# Patient Record
Sex: Male | Born: 1985 | Race: White | Hispanic: No | Marital: Married | Smoking: Never smoker
Health system: Southern US, Community
[De-identification: ages and names within clinical notes are randomized; demographics above are authoritative.]

## PROBLEM LIST (undated history)

## (undated) DIAGNOSIS — E7800 Pure hypercholesterolemia, unspecified: Secondary | ICD-10-CM

## (undated) HISTORY — PX: NOSE SURGERY: SHX723

---

## 2016-12-31 ENCOUNTER — Ambulatory Visit (INDEPENDENT_AMBULATORY_CARE_PROVIDER_SITE_OTHER): Payer: BLUE CROSS/BLUE SHIELD | Admitting: Family Medicine

## 2016-12-31 ENCOUNTER — Encounter: Payer: Self-pay | Admitting: Family Medicine

## 2016-12-31 VITALS — BP 124/74 | HR 85 | Temp 98.0°F | Ht 64.0 in | Wt 180.6 lb

## 2016-12-31 DIAGNOSIS — E66811 Obesity, class 1: Secondary | ICD-10-CM | POA: Insufficient documentation

## 2016-12-31 DIAGNOSIS — Z0001 Encounter for general adult medical examination with abnormal findings: Secondary | ICD-10-CM

## 2016-12-31 DIAGNOSIS — E669 Obesity, unspecified: Secondary | ICD-10-CM | POA: Diagnosis not present

## 2016-12-31 LAB — COMPREHENSIVE METABOLIC PANEL
ALBUMIN: 4.9 g/dL (ref 3.5–5.2)
ALK PHOS: 60 U/L (ref 39–117)
ALT: 51 U/L (ref 0–53)
AST: 27 U/L (ref 0–37)
BILIRUBIN TOTAL: 0.6 mg/dL (ref 0.2–1.2)
BUN: 17 mg/dL (ref 6–23)
CO2: 25 mEq/L (ref 19–32)
Calcium: 9.7 mg/dL (ref 8.4–10.5)
Chloride: 103 mEq/L (ref 96–112)
Creatinine, Ser: 0.98 mg/dL (ref 0.40–1.50)
GFR: 94.86 mL/min (ref >60.00)
Glucose, Bld: 83 mg/dL (ref 70–99)
POTASSIUM: 4.6 meq/L (ref 3.5–5.1)
Sodium: 137 mEq/L (ref 135–145)
TOTAL PROTEIN: 7.3 g/dL (ref 6.0–8.3)

## 2016-12-31 LAB — CBC
HEMATOCRIT: 47.1 % (ref 39.0–52.0)
HEMOGLOBIN: 15.4 g/dL (ref 13.0–17.0)
MCHC: 32.8 g/dL (ref 30.0–36.0)
MCV: 91.7 fl (ref 78.0–100.0)
Platelets: 379 10*3/uL (ref 150.0–400.0)
RBC: 5.13 Mil/uL (ref 4.22–5.81)
RDW: 14 % (ref 11.5–15.5)
WBC: 6.9 10*3/uL (ref 4.0–10.5)

## 2016-12-31 LAB — LDL CHOLESTEROL, DIRECT: Direct LDL: 195 mg/dL

## 2016-12-31 LAB — LIPID PANEL
CHOLESTEROL: 263 mg/dL — AB (ref 0–200)
HDL: 45.4 mg/dL (ref >39.00)
NonHDL: 217.33
Total CHOL/HDL Ratio: 6
Triglycerides: 220 mg/dL — ABNORMAL HIGH (ref 0.0–149.0)
VLDL: 44 mg/dL — AB (ref 0.0–40.0)

## 2016-12-31 NOTE — Patient Instructions (Signed)
Preventive Care 18-39 Years, Male Preventive care refers to lifestyle choices and visits with your health care provider that can promote health and wellness. What does preventive care include?  A yearly physical exam. This is also called an annual well check.  Dental exams once or twice a year.  Routine eye exams. Ask your health care provider how often you should have your eyes checked.  Personal lifestyle choices, including: ? Daily care of your teeth and gums. ? Regular physical activity. ? Eating a healthy diet. ? Avoiding tobacco and drug use. ? Limiting alcohol use. ? Practicing safe sex. What happens during an annual well check? The services and screenings done by your health care provider during your annual well check will depend on your age, overall health, lifestyle risk factors, and family history of disease. Counseling Your health care provider may ask you questions about your:  Alcohol use.  Tobacco use.  Drug use.  Emotional well-being.  Home and relationship well-being.  Sexual activity.  Eating habits.  Work and work Statistician.  Screening You may have the following tests or measurements:  Height, weight, and BMI.  Blood pressure.  Lipid and cholesterol levels. These may be checked every 5 years starting at age 34.  Diabetes screening. This is done by checking your blood sugar (glucose) after you have not eaten for a while (fasting).  Skin check.  Hepatitis C blood test.  Hepatitis B blood test.  Sexually transmitted disease (STD) testing.  Discuss your test results, treatment options, and if necessary, the need for more tests with your health care provider. Vaccines Your health care provider may recommend certain vaccines, such as:  Influenza vaccine. This is recommended every year.  Tetanus, diphtheria, and acellular pertussis (Tdap, Td) vaccine. You may need a Td booster every 10 years.  Varicella vaccine. You may need this if you  have not been vaccinated.  HPV vaccine. If you are 23 or younger, you may need three doses over 6 months.  Measles, mumps, and rubella (MMR) vaccine. You may need at least one dose of MMR.You may also need a second dose.  Pneumococcal 13-valent conjugate (PCV13) vaccine. You may need this if you have certain conditions and have not been vaccinated.  Pneumococcal polysaccharide (PPSV23) vaccine. You may need one or two doses if you smoke cigarettes or if you have certain conditions.  Meningococcal vaccine. One dose is recommended if you are age 65-21 years and a first-year college student living in a residence hall, or if you have one of several medical conditions. You may also need additional booster doses.  Hepatitis A vaccine. You may need this if you have certain conditions or if you travel or work in places where you may be exposed to hepatitis A.  Hepatitis B vaccine. You may need this if you have certain conditions or if you travel or work in places where you may be exposed to hepatitis B.  Haemophilus influenzae type b (Hib) vaccine. You may need this if you have certain risk factors.  Talk to your health care provider about which screenings and vaccines you need and how often you need them. This information is not intended to replace advice given to you by your health care provider. Make sure you discuss any questions you have with your health care provider. Document Released: 02/18/2001 Document Revised: 09/12/2015 Document Reviewed: 10/24/2014 Elsevier Interactive Patient Education  [employer].

## 2016-12-31 NOTE — Progress Notes (Signed)
Subjective:  Gregory Singleton is a 31 y.o. male who presents today for his annual comprehensive physical exam and to establish care  HPI:  Patient has still been living in [place] since 2014.  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  Wife's family lives in [place].  Gregory Singleton has no acute complaints today.   Lifestyle Diet: Does not follow any diet strictly.  Has been loosely following the plant paradox diet. Exercise: Regularly goes to the gym to work on cardio and strength training.  Depression screen PHQ 2/9 12/31/2016  Decreased Interest 0  Down, Depressed, Hopeless 0  PHQ - 2 Score 0   Health Maintenance Due  Topic Date Due  . TETANUS/TDAP  01/22/2005    ROS: Per HPI, otherwise a 14 point review of systems was performed and was negative  PMH:  The following were reviewed and entered/updated in epic: History reviewed. No pertinent past medical history. There are no active problems to display for this patient.  History reviewed. No pertinent surgical history.  History reviewed. No pertinent family history.  Medications- reviewed and updated No current outpatient medications on file.   No current facility-administered medications for this visit.    Allergies-reviewed and updated No Known Allergies  Social History   Socioeconomic History  . Marital status: Married    Spouse name: None  . Number of children: None  . Years of education: None  . Highest education level: None  Social Needs  . Financial resource strain: None  . Food insecurity - worry: None  . Food insecurity - inability: None  . Transportation needs - medical: None  . Transportation needs - non-medical: None  Occupational History  . None  Tobacco Use  . Smoking status: Never Smoker  . Smokeless tobacco: Never Used  Substance and Sexual Activity  . Alcohol use: Yes    Comment: Occasionally  . Drug use: No  .  Sexual activity: Yes  Other Topics Concern  . None  Social History Narrative  . None   Objective:  Physical Exam: BP 124/74 (BP Location: Left Arm, Patient Position: Sitting, Cuff Size: Normal)   Pulse 85   Temp 98 F (36.7 C) (Oral)   Ht 5\' 4"  (1.626 m)   Wt 180 lb 9.6 oz (81.9 kg)   SpO2 98%   BMI 31.00 kg/m   Body mass index is 31 kg/m. Gen: NAD, resting comfortably HEENT: TMs normal bilaterally. OP clear. No thyromegaly noted.  CV: RRR with no murmurs appreciated Pulm: NWOB, CTAB with no crackles, wheezes, or rhonchi GI: Normal bowel sounds present. Soft, Nontender, Nondistended. MSK: no edema, cyanosis, or clubbing noted Skin: warm, dry Neuro: CN2-12 grossly intact. Strength 5/5 in upper and lower extremities. Reflexes symmetric and intact bilaterally.  Psych: Normal affect and thought content  Assessment/Plan:  Class 1 obesity without serious comorbidity in adult Discussed lifestyle modifications including healthy diet and regular exercise.  Preventative Healthcare: Check CBC, CMET, and a lipid panel today.  Patient Counseling:  -Nutrition: Stressed importance of moderation in sodium/caffeine intake, saturated fat and cholesterol, caloric balance, sufficient intake of fresh fruits, vegetables, and fiber.  -Stressed the importance of regular exercise.   -Substance Abuse: Discussed cessation/primary prevention of tobacco, alcohol, or other drug use; driving or other dangerous activities under the influence; availability of treatment for abuse.   -Injury prevention: Discussed safety belts, safety helmets, smoke detector, smoking near bedding  or upholstery.   -Sexuality: Discussed sexually transmitted diseases, partner selection, use of condoms, avoidance of unintended pregnancy and contraceptive alternatives.   -Dental health: Discussed importance of regular tooth brushing, flossing, and dental visits.  -Health maintenance and immunizations reviewed. Please refer to  Health maintenance section.  Return to care in 1 year for next preventative visit.   Katina Degreealeb M. Jimmey RalphParker, MD 12/31/2016 10:03 AM

## 2016-12-31 NOTE — Assessment & Plan Note (Signed)
Discussed lifestyle modifications including healthy diet and regular exercise. 

## 2017-01-01 NOTE — Progress Notes (Signed)
Cholesterol is elevated. His "bad" cholesterol is 195, it should ideally be closer to 100 or lower. It would be beneficial to start a cholesterol medication if the patient is willing. Regardless he should continue working on regular exercise and a healthy diet. We should recheck in 6-12 months.  His other tests were all normal.   Kaydee Magel M. Jimmey RalphParker, MD 01/01/2017 8:38 AM

## 2017-01-23 ENCOUNTER — Emergency Department (HOSPITAL_COMMUNITY)
Admission: EM | Admit: 2017-01-23 | Discharge: 2017-01-23 | Disposition: A | Discharge status: Home / Self Care | Payer: BLUE CROSS/BLUE SHIELD | Attending: Emergency Medicine | Admitting: Emergency Medicine

## 2017-01-23 ENCOUNTER — Emergency Department (HOSPITAL_COMMUNITY): Payer: BLUE CROSS/BLUE SHIELD

## 2017-01-23 ENCOUNTER — Encounter (HOSPITAL_COMMUNITY): Payer: Self-pay

## 2017-01-23 ENCOUNTER — Other Ambulatory Visit: Payer: Self-pay

## 2017-01-23 DIAGNOSIS — R109 Unspecified abdominal pain: Secondary | ICD-10-CM | POA: Diagnosis not present

## 2017-01-23 DIAGNOSIS — R1012 Left upper quadrant pain: Secondary | ICD-10-CM | POA: Diagnosis not present

## 2017-01-23 HISTORY — DX: Pure hypercholesterolemia, unspecified: E78.00

## 2017-01-23 LAB — COMPREHENSIVE METABOLIC PANEL
ALT: 33 U/L (ref 17–63)
AST: 25 U/L (ref 15–41)
Albumin: 4.7 g/dL (ref 3.5–5.0)
Alkaline Phosphatase: 71 U/L (ref 38–126)
Anion gap: 7 (ref 5–15)
BUN: 9 mg/dL (ref 6–20)
CALCIUM: 9.7 mg/dL (ref 8.9–10.3)
CHLORIDE: 107 mmol/L (ref 101–111)
CO2: 25 mmol/L (ref 22–32)
Creatinine, Ser: 0.99 mg/dL (ref 0.61–1.24)
GFR calc Af Amer: >60 mL/min (ref >60)
GFR calc non Af Amer: >60 mL/min (ref >60)
Glucose, Bld: 88 mg/dL (ref 65–99)
Potassium: 4.4 mmol/L (ref 3.5–5.1)
SODIUM: 139 mmol/L (ref 135–145)
Total Bilirubin: 0.9 mg/dL (ref 0.3–1.2)
Total Protein: 8.2 g/dL — ABNORMAL HIGH (ref 6.5–8.1)

## 2017-01-23 LAB — URINALYSIS, ROUTINE W REFLEX MICROSCOPIC
BACTERIA UA: NONE SEEN
BILIRUBIN URINE: NEGATIVE
GLUCOSE, UA: NEGATIVE mg/dL
Ketones, ur: NEGATIVE mg/dL
LEUKOCYTES UA: NEGATIVE
Nitrite: NEGATIVE
PROTEIN: NEGATIVE mg/dL
SPECIFIC GRAVITY, URINE: 1.005 (ref 1.005–1.030)
Squamous Epithelial / LPF: NONE SEEN
WBC UA: NONE SEEN WBC/hpf (ref 0–5)
pH: 6 (ref 5.0–8.0)

## 2017-01-23 LAB — CBC
HEMATOCRIT: 46.3 % (ref 39.0–52.0)
HEMOGLOBIN: 15.8 g/dL (ref 13.0–17.0)
MCH: 30.4 pg (ref 26.0–34.0)
MCHC: 34.1 g/dL (ref 30.0–36.0)
MCV: 89 fL (ref 78.0–100.0)
Platelets: 324 10*3/uL (ref 150–400)
RBC: 5.2 MIL/uL (ref 4.22–5.81)
RDW: 13.5 % (ref 11.5–15.5)
WBC: 7.4 10*3/uL (ref 4.0–10.5)

## 2017-01-23 LAB — MONONUCLEOSIS SCREEN: MONO SCREEN: NEGATIVE

## 2017-01-23 LAB — LIPASE, BLOOD: LIPASE: 25 U/L (ref 11–51)

## 2017-01-23 MED ORDER — SODIUM CHLORIDE 0.9 % IV BOLUS (SEPSIS)
1000.0000 mL | Freq: Once | INTRAVENOUS | Status: AC
  Administered 2017-01-23: 1000 mL via INTRAVENOUS

## 2017-01-23 MED ORDER — IOPAMIDOL (ISOVUE-300) INJECTION 61%
100.0000 mL | Freq: Once | INTRAVENOUS | Status: AC | PRN
  Administered 2017-01-23: 100 mL via INTRAVENOUS

## 2017-01-23 MED ORDER — IOPAMIDOL (ISOVUE-300) INJECTION 61%
INTRAVENOUS | Status: AC
Start: 2017-01-23 — End: 2017-01-24
  Filled 2017-01-23: qty 100

## 2017-01-23 NOTE — ED Triage Notes (Signed)
Patient c/o left abdominal pain x 2 days. Patient denies any N/v/D.

## 2017-01-23 NOTE — ED Notes (Signed)
Purple and light green blood tube sent to lab.  Urine cup and urine culture tube sent to lab.

## 2017-01-23 NOTE — ED Provider Notes (Signed)
[facility]-EMERGENCY DEPT Provider Note   CSN: [id number] Arrival date & time: 01/23/17  4098     History   Chief Complaint No chief complaint on file.   HPI Gregory Singleton is a 32 y.o. male.  Patient presents with upper abdominal pain for 3 days left greater than right.  He is able to eat but has a decreased appetite.  He also complains of minimal discomfort in his left medial supraclavicular area.  No prodromal illnesses, fever, sweats, chills, nausea, vomiting, diarrhea, dysuria.  He is normally healthy.  No previous abdominal surgery.  Social history: works as an Pensions consultant.  Severity of symptoms is moderate.  Nothing makes symptoms better or worse.      Past Medical History:  Diagnosis Date  . High cholesterol     Patient Active Problem List   Diagnosis Date Noted  . Class 1 obesity without serious comorbidity in adult 12/31/2016    Past Surgical History:  Procedure Laterality Date  . NOSE SURGERY     Surgery to repair a broken nose at age 19       Home Medications    Prior to Admission medications   Not on File    Family History Family History  Problem Relation Age of Onset  . Cancer Mother   . Heart attack Father   . Cancer Maternal Grandmother   . Heart attack Maternal Grandfather     Social History Social History   Tobacco Use  . Smoking status: Never Smoker  . Smokeless tobacco: Never Used  Substance Use Topics  . Alcohol use: Yes    Comment: Occasionally  . Drug use: No     Allergies   Patient has no known allergies.   Review of Systems Review of Systems  All other systems reviewed and are negative.    Physical Exam Updated Vital Signs BP 133/75 (BP Location: Right Arm)   Pulse 81   Temp 98.3 F (36.8 C) (Oral)   Resp 18   Ht 5\' 4"  (1.626 m)   Wt 77.1 kg (170 lb)   SpO2 100%   BMI 29.18 kg/m   Physical Exam  Constitutional: He is oriented to person, place, and time. He appears well-developed  and well-nourished.  HENT:  Head: Normocephalic and atraumatic.  Eyes: Conjunctivae are normal.  Neck: Neck supple.  Cardiovascular: Normal rate and regular rhythm.  Pulmonary/Chest: Effort normal and breath sounds normal.  Abdominal: Soft. Bowel sounds are normal.  Minimal upper abdominal tenderness left greater than right  Musculoskeletal: Normal range of motion.  Neurological: He is alert and oriented to person, place, and time.  Skin: Skin is warm and dry.  Psychiatric: He has a normal mood and affect. His behavior is normal.  Nursing note and vitals reviewed.    ED Treatments / Results  Labs (all labs ordered are listed, but only abnormal results are displayed) Labs Reviewed  COMPREHENSIVE METABOLIC PANEL - Abnormal; Notable for the following components:      Result Value   Total Protein 8.2 (*)    All other components within normal limits  URINALYSIS, ROUTINE W REFLEX MICROSCOPIC - Abnormal; Notable for the following components:   Color, Urine STRAW (*)    Hgb urine dipstick SMALL (*)    All other components within normal limits  LIPASE, BLOOD  CBC  MONONUCLEOSIS SCREEN    EKG  EKG Interpretation None       Radiology Dg Chest 2 View  Result Date: 01/23/2017  CLINICAL DATA:  32 y/o male with LUQ pain x3 days. Also c/o onset of left clavicle soreness today. Non-smoker. EXAM: CHEST  2 VIEW COMPARISON:  None. FINDINGS: Normal mediastinum and cardiac silhouette. Normal pulmonary vasculature. No evidence of effusion, infiltrate, or pneumothorax. No acute bony abnormality. IMPRESSION: Normal chest radiograph. Electronically Signed   By: Genevive BiStewart  Edmunds M.D.   On: 01/23/2017 19:01   Ct Abdomen Pelvis W Contrast  Result Date: 01/23/2017 CLINICAL DATA:  32 year old male with left-sided abdominal pain for 2 days. EXAM: CT ABDOMEN AND PELVIS WITH CONTRAST TECHNIQUE: Multidetector CT imaging of the abdomen and pelvis was performed using the standard protocol following bolus  administration of intravenous contrast. CONTRAST:  100mL ISOVUE-300 IOPAMIDOL (ISOVUE-300) INJECTION 61% COMPARISON:  None. FINDINGS: Lower chest: Minimal left basilar atelectasis noted. Hepatobiliary: The liver and gallbladder are unremarkable. No biliary dilatation. Pancreas: Unremarkable Spleen: A very small amount of fluid/stranding along the superior aspect of the spleen identified without splenic abnormality. No other abnormalities in this area identified. Adrenals/Urinary Tract: The kidneys, bladder and adrenal glands are unremarkable. Stomach/Bowel: Stomach is within normal limits. Appendix appears normal. No evidence of bowel wall thickening, distention, or inflammatory changes. Vascular/Lymphatic: No significant vascular findings are present. No enlarged abdominal or pelvic lymph nodes. Reproductive: Prostate is unremarkable. Other: No abdominal wall hernia or abnormality. No abdominopelvic ascites. Musculoskeletal: No acute or significant osseous findings. IMPRESSION: 1. Very small amount of fluid/inflammation adjacent to the spleen without splenic or other adjacent abnormality. This is of uncertain clinical significance, but may be causing this patient's left upper abdominal pain. Correlate with any injury to this area. 2. No other significant abnormalities. Electronically Signed   By: Harmon PierJeffrey  Hu M.D.   On: 01/23/2017 18:57    Procedures Procedures (including critical care time)  Medications Ordered in ED Medications  iopamidol (ISOVUE-300) 61 % injection (not administered)  sodium chloride 0.9 % bolus 1,000 mL (0 mLs Intravenous Stopped 01/23/17 2107)  iopamidol (ISOVUE-300) 61 % injection 100 mL (100 mLs Intravenous Contrast Given 01/23/17 1809)     Initial Impression / Assessment and Plan / ED Course  I have reviewed the triage vital signs and the nursing notes.  Pertinent labs & imaging results that were available during my care of the patient were reviewed by me and considered in my  medical decision making (see chart for details).     Patient presents with left upper quadrant greater than right upper quadrant pain.  No acute abdomen on physical exam.  Labs, urinalysis, chest x-ray all normal.   CT reveals a small amount of fluid on the superior aspect of the spleen.  With normal vital signs and labs, I am uncertain of the clinical significance.  No acute abdomen at discharge.  Discussed with the patient and his wife.  Final Clinical Impressions(s) / ED Diagnoses   Final diagnoses:  Left upper quadrant pain    ED Discharge Orders    None       Donnetta Hutchingook, Annalysia Willenbring, MD 01/23/17 2134

## 2017-01-23 NOTE — ED Notes (Signed)
Pt is alert and oriented x 4 and is verbally . Pt reports having abdominal pain x 3 4/10 pain. Pt denies n/v/d, fever. Denies urinary symptoms.

## 2017-01-23 NOTE — Discharge Instructions (Addendum)
Blood work, urinalysis, chest x-ray all normal.  CT scan reveals a possible very small amount of fluid on the upper edge of your spleen.  This could be causing your pain.  There is a mononucleosis test pending.

## 2017-08-21 ENCOUNTER — Encounter: Payer: Self-pay | Admitting: Family Medicine

## 2017-08-21 ENCOUNTER — Ambulatory Visit (INDEPENDENT_AMBULATORY_CARE_PROVIDER_SITE_OTHER): Payer: BLUE CROSS/BLUE SHIELD | Admitting: Family Medicine

## 2017-08-21 VITALS — BP 120/74 | HR 67 | Temp 97.9°F | Ht 64.0 in | Wt 180.6 lb

## 2017-08-21 DIAGNOSIS — Z0001 Encounter for general adult medical examination with abnormal findings: Secondary | ICD-10-CM

## 2017-08-21 DIAGNOSIS — Z6831 Body mass index (BMI) 31.0-31.9, adult: Secondary | ICD-10-CM | POA: Diagnosis not present

## 2017-08-21 DIAGNOSIS — Z13220 Encounter for screening for lipoid disorders: Secondary | ICD-10-CM | POA: Diagnosis not present

## 2017-08-21 DIAGNOSIS — E669 Obesity, unspecified: Secondary | ICD-10-CM

## 2017-08-21 DIAGNOSIS — Z23 Encounter for immunization: Secondary | ICD-10-CM

## 2017-08-21 LAB — LIPID PANEL
Cholesterol: 266 mg/dL — ABNORMAL HIGH (ref 0–200)
HDL: 37.5 mg/dL — ABNORMAL LOW (ref >39.00)
NonHDL: 228.81
Total CHOL/HDL Ratio: 7
Triglycerides: 378 mg/dL — ABNORMAL HIGH (ref 0.0–149.0)
VLDL: 75.6 mg/dL — ABNORMAL HIGH (ref 0.0–40.0)

## 2017-08-21 LAB — LDL CHOLESTEROL, DIRECT: LDL DIRECT: 180 mg/dL

## 2017-08-21 NOTE — Assessment & Plan Note (Signed)
Encouraged lifestyle modifications.

## 2017-08-21 NOTE — Progress Notes (Signed)
Subjective:  Gregory Singleton is a 32 y.o. male who presents today for his annual comprehensive physical exam.    HPI:  He has no acute complaints today.   He is expecting a baby girl in 6 weeks and wants to make sure he is healthy.   Lifestyle Diet: No specific diets.  Exercise: Tries to go to the gym and work cardio.   Depression screen PHQ 2/9 08/21/2017  Decreased Interest 0  Down, Depressed, Hopeless 0  PHQ - 2 Score 0    There are no preventive care reminders to display for this patient.   ROS: A complete review of systems was negative.   PMH:  The following were reviewed and entered/updated in epic: Past Medical History:  Diagnosis Date  . High cholesterol    Patient Active Problem List   Diagnosis Date Noted  . Class 1 obesity without serious comorbidity in adult 12/31/2016   Past Surgical History:  Procedure Laterality Date  . NOSE SURGERY     Surgery to repair a broken nose at age 32    Family History  Problem Relation Age of Onset  . Cancer Mother   . Heart attack Father   . Cancer Maternal Grandmother   . Heart attack Maternal Grandfather     Medications- reviewed and updated No current outpatient medications on file.   No current facility-administered medications for this visit.     Allergies-reviewed and updated No Known Allergies  Social History   Socioeconomic History  . Marital status: Married    Spouse name: Not on file  . Number of children: Not on file  . Years of education: Not on file  . Highest education level: Not on file  Occupational History  . Not on file  Social Needs  . Financial resource strain: Not on file  . Food insecurity:    Worry: Not on file    Inability: Not on file  . Transportation needs:    Medical: Not on file    Non-medical: Not on file  Tobacco Use  . Smoking status: Never Smoker  . Smokeless tobacco: Never Used  Substance and Sexual Activity  . Alcohol use: Yes    Comment: Occasionally  .  Drug use: No  . Sexual activity: Yes  Lifestyle  . Physical activity:    Days per week: Not on file    Minutes per session: Not on file  . Stress: Not on file  Relationships  . Social connections:    Talks on phone: Not on file    Gets together: Not on file    Attends religious service: Not on file    Active member of club or organization: Not on file    Attends meetings of clubs or organizations: Not on file    Relationship status: Not on file  Other Topics Concern  . Not on file  Social History Narrative  . Not on file    Objective:  Physical Exam: BP 120/74 (BP Location: Left Arm, Patient Position: Sitting, Cuff Size: Normal)   Pulse 67   Temp 97.9 F (36.6 C) (Oral)   Ht 5\' 4"  (1.626 m)   Wt 180 lb 9.6 oz (81.9 kg)   SpO2 97%   BMI 31.00 kg/m   Body mass index is 31 kg/m. Wt Readings from Last 3 Encounters:  08/21/17 180 lb 9.6 oz (81.9 kg)  01/23/17 170 lb (77.1 kg)  12/31/16 180 lb 9.6 oz (81.9 kg)  Gen: NAD, resting comfortably  HEENT: TMs normal bilaterally. OP clear. No thyromegaly noted.  CV: RRR with no murmurs appreciated Pulm: NWOB, CTAB with no crackles, wheezes, or rhonchi GI: Normal bowel sounds present. Soft, Nontender, Nondistended. MSK: no edema, cyanosis, or clubbing noted Skin: warm, dry Neuro: CN2-12 grossly intact. Strength 5/5 in upper and lower extremities. Reflexes symmetric and intact bilaterally.  Psych: Normal affect and thought content  Assessment/Plan:  Class 1 obesity without serious comorbidity in adult Encouraged lifestyle modifications.   Preventative Healthcare: Check lipid panel. Tdap given today.   Patient Counseling(The following topics were reviewed and/or handout was given):  -Nutrition: Stressed importance of moderation in sodium/caffeine intake, saturated fat and cholesterol, caloric balance, sufficient intake of fresh fruits, vegetables, and fiber.  -Stressed the importance of regular exercise.   -Substance Abuse:  Discussed cessation/primary prevention of tobacco, alcohol, or other drug use; driving or other dangerous activities under the influence; availability of treatment for abuse.   -Injury prevention: Discussed safety belts, safety helmets, smoke detector, smoking near bedding or upholstery.   -Sexuality: Discussed sexually transmitted diseases, partner selection, use of condoms, avoidance of unintended pregnancy and contraceptive alternatives.   -Dental health: Discussed importance of regular tooth brushing, flossing, and dental visits.  -Health maintenance and immunizations reviewed. Please refer to Health maintenance section.  Return to care in 1 year for next preventative visit.   Katina Degreealeb M. Jimmey RalphParker, MD 08/21/2017 9:44 AM

## 2017-08-21 NOTE — Progress Notes (Signed)
Please inform patient of the following:  His "bad" cholesterol is still high, but better. His good cholesterol dropped a small amount and his triglycerides are higher.   He may benefit from starting a cholesterol med to improve his numbers and lower his risk of heart attack and stroke. Please send in lipitor 40mg  daily if he is willing to start it.  Otherwise he should work on diet and exercise and we can recheck in 1 year.  Katina Degreealeb M. Jimmey RalphParker, MD 08/21/2017 1:18 PM

## 2017-08-21 NOTE — Patient Instructions (Signed)
It was very nice to see you today!  Keep up the good work!  Congratulations on your baby girl!  Come back to see me in 1 year, or sooner as needed.  Take care, Dr Jerline Pain   Preventive Care 18-39 Years, Male Preventive care refers to lifestyle choices and visits with your health care provider that can promote health and wellness. What does preventive care include?  A yearly physical exam. This is also called an annual well check.  Dental exams once or twice a year.  Routine eye exams. Ask your health care provider how often you should have your eyes checked.  Personal lifestyle choices, including: ? Daily care of your teeth and gums. ? Regular physical activity. ? Eating a healthy diet. ? Avoiding tobacco and drug use. ? Limiting alcohol use. ? Practicing safe sex. What happens during an annual well check? The services and screenings done by your health care provider during your annual well check will depend on your age, overall health, lifestyle risk factors, and family history of disease. Counseling Your health care provider may ask you questions about your:  Alcohol use.  Tobacco use.  Drug use.  Emotional well-being.  Home and relationship well-being.  Sexual activity.  Eating habits.  Work and work Statistician.  Screening You may have the following tests or measurements:  Height, weight, and BMI.  Blood pressure.  Lipid and cholesterol levels. These may be checked every 5 years starting at age 47.  Diabetes screening. This is done by checking your blood sugar (glucose) after you have not eaten for a while (fasting).  Skin check.  Hepatitis C blood test.  Hepatitis B blood test.  Sexually transmitted disease (STD) testing.  Discuss your test results, treatment options, and if necessary, the need for more tests with your health care provider. Vaccines Your health care provider may recommend certain vaccines, such as:  Influenza vaccine. This  is recommended every year.  Tetanus, diphtheria, and acellular pertussis (Tdap, Td) vaccine. You may need a Td booster every 10 years.  Varicella vaccine. You may need this if you have not been vaccinated.  HPV vaccine. If you are 14 or younger, you may need three doses over 6 months.  Measles, mumps, and rubella (MMR) vaccine. You may need at least one dose of MMR.You may also need a second dose.  Pneumococcal 13-valent conjugate (PCV13) vaccine. You may need this if you have certain conditions and have not been vaccinated.  Pneumococcal polysaccharide (PPSV23) vaccine. You may need one or two doses if you smoke cigarettes or if you have certain conditions.  Meningococcal vaccine. One dose is recommended if you are age 78-21 years and a first-year college student living in a residence hall, or if you have one of several medical conditions. You may also need additional booster doses.  Hepatitis A vaccine. You may need this if you have certain conditions or if you travel or work in places where you may be exposed to hepatitis A.  Hepatitis B vaccine. You may need this if you have certain conditions or if you travel or work in places where you may be exposed to hepatitis B.  Haemophilus influenzae type b (Hib) vaccine. You may need this if you have certain risk factors.  Talk to your health care provider about which screenings and vaccines you need and how often you need them. This information is not intended to replace advice given to you by your health care provider. Make sure you discuss any  questions you have with your health care provider. Document Released: 02/18/2001 Document Revised: 09/12/2015 Document Reviewed: 10/24/2014 Elsevier Interactive Patient Education  [employer].

## 2017-09-02 ENCOUNTER — Encounter: Payer: Self-pay | Admitting: Family Medicine

## 2017-10-02 NOTE — Telephone Encounter (Signed)
Pt calling.  States that to come to get form from office it will be 45 minutes.   Pt wants to know if this can be sent back through MyChart. Pt can be reached at [phone].

## 2018-09-05 IMAGING — CT CT ABD-PELV W/ CM
2 of 4 series · 16 of 46 positions shown, 18 images · IV contrast (ISOVUE)
Comparison: None.

CLINICAL DATA: 32-year-old male with left-sided abdominal pain for
2 days.

EXAM:
CT ABDOMEN AND PELVIS WITH CONTRAST
TECHNIQUE: Multidetector CT imaging of the abdomen and pelvis was performed
using the standard protocol following bolus administration of
intravenous contrast.
CONTRAST:  100mL WCM50R-C66 IOPAMIDOL (WCM50R-C66) INJECTION 61%

[Series 2: axial st · axial · 0.69mm/px · z∈[-582,-192]mm · 13 of 88 slices shown, 15 images]
[im 5/88  soft-tissue]
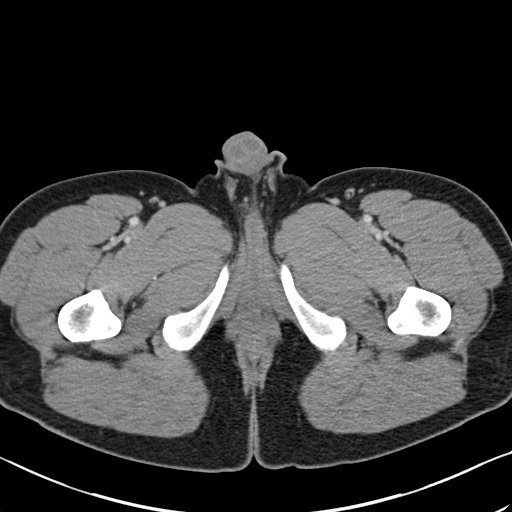
[im 5/88  bone]
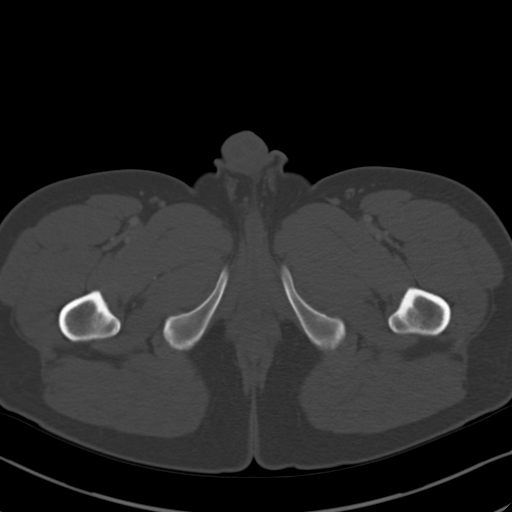
[im 13/88  soft-tissue]
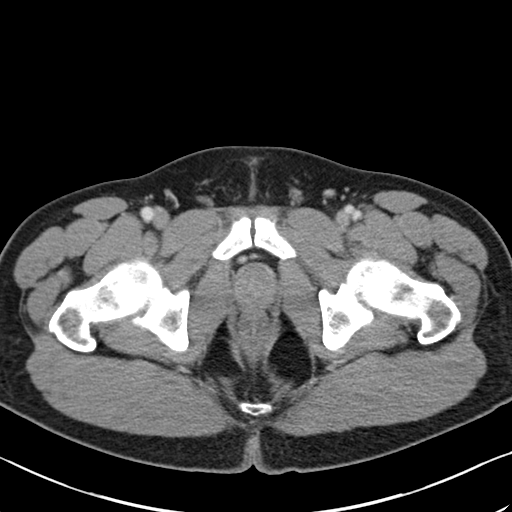
[im 17/88  soft-tissue]
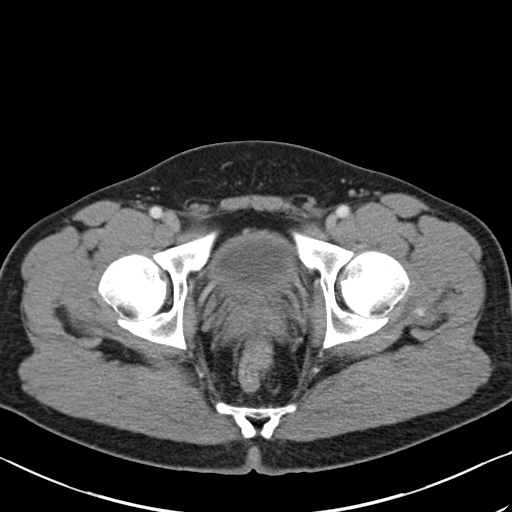
[im 25/88  soft-tissue]
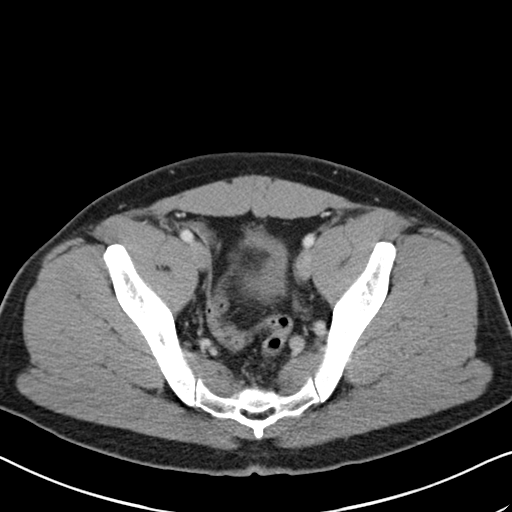
[im 30/88  soft-tissue]
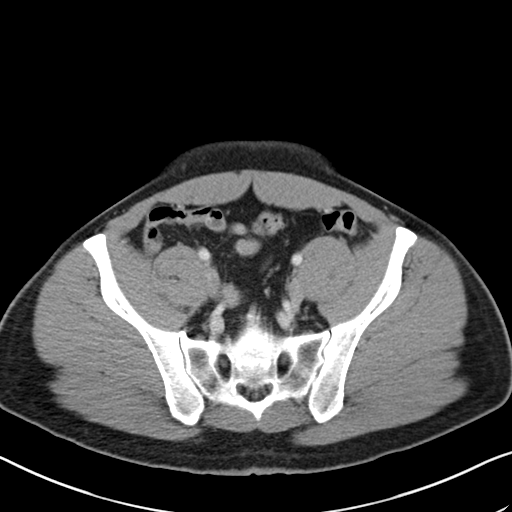
[im 38/88  soft-tissue]
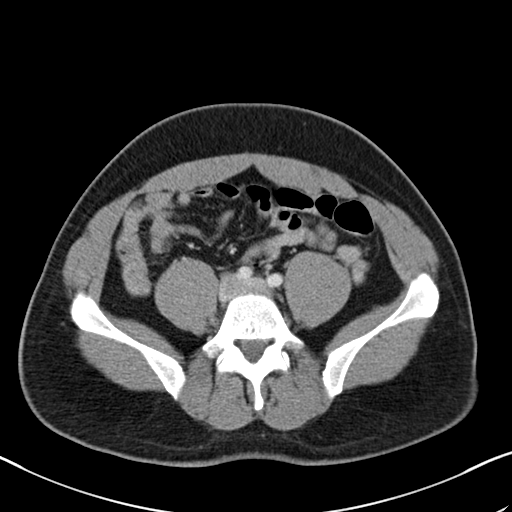
[im 46/88  soft-tissue]
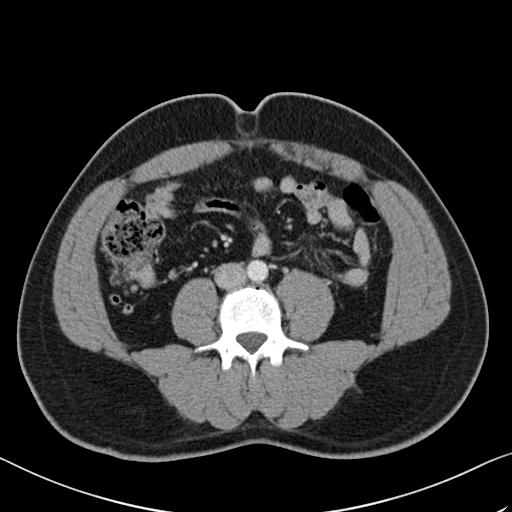
[im 50/88  soft-tissue]
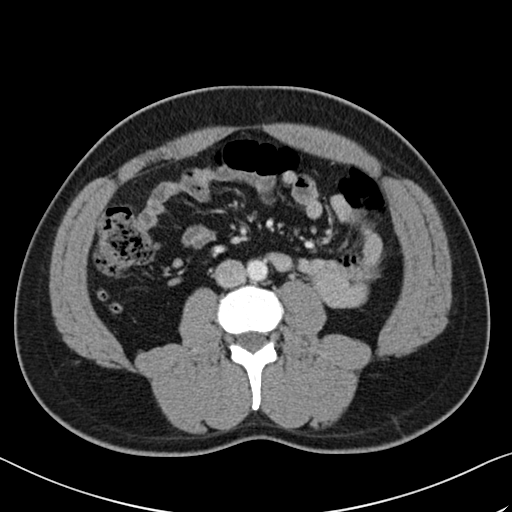
[im 59/88  soft-tissue]
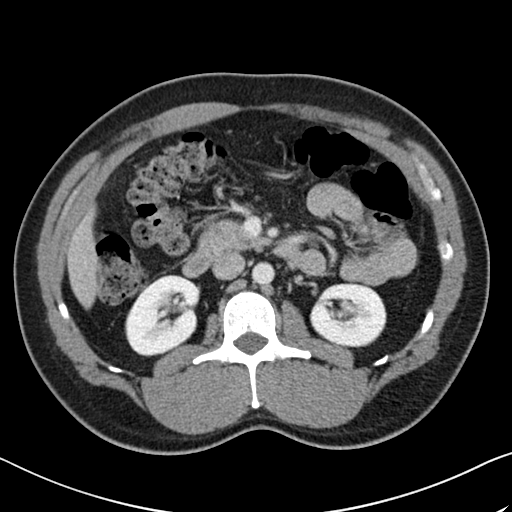
[im 59/88  bone]
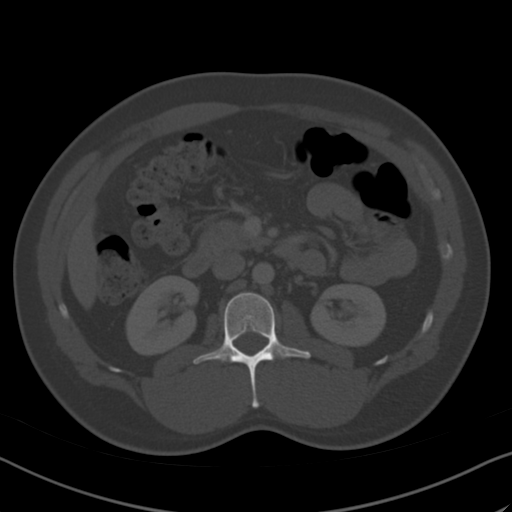
[im 63/88  soft-tissue]
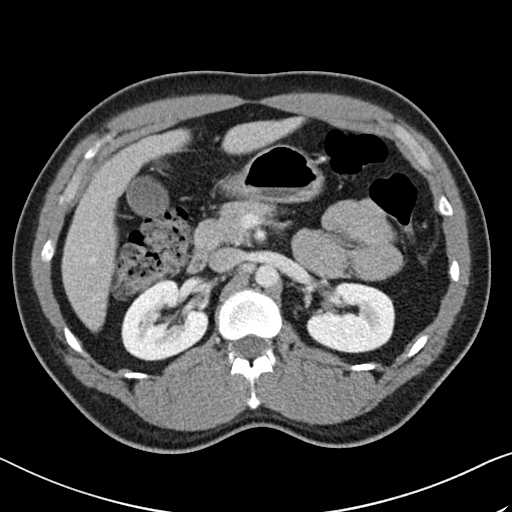
[im 71/88  soft-tissue]
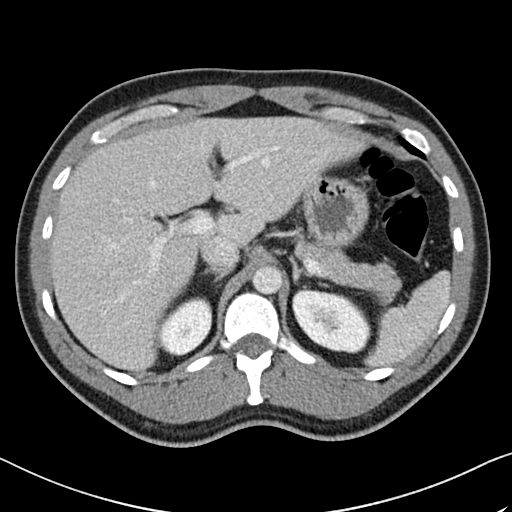
[im 75/88  soft-tissue]
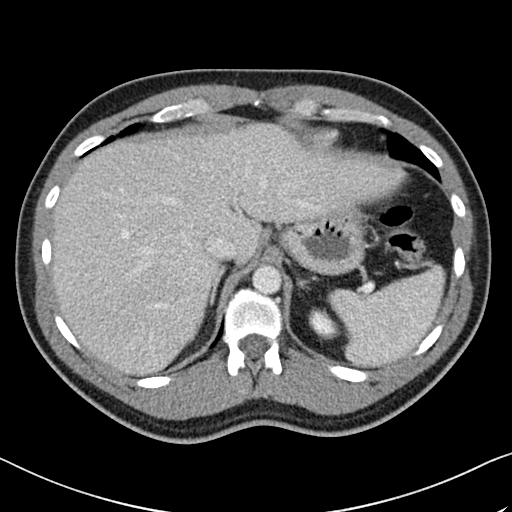
[im 83/88  soft-tissue]
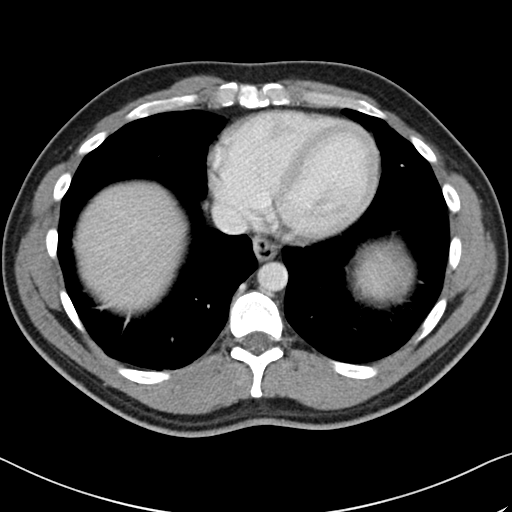

[Series 5: coronal st · coronal · 0.76mm/px · 3 of 92 slices shown]
[im 31/92  soft-tissue]
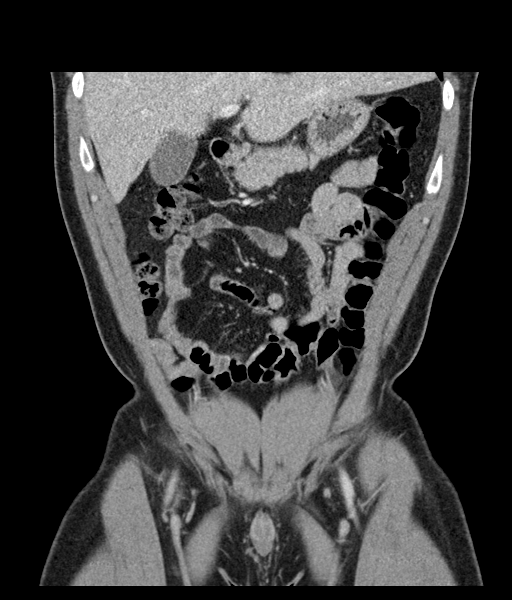
[im 41/92  soft-tissue]
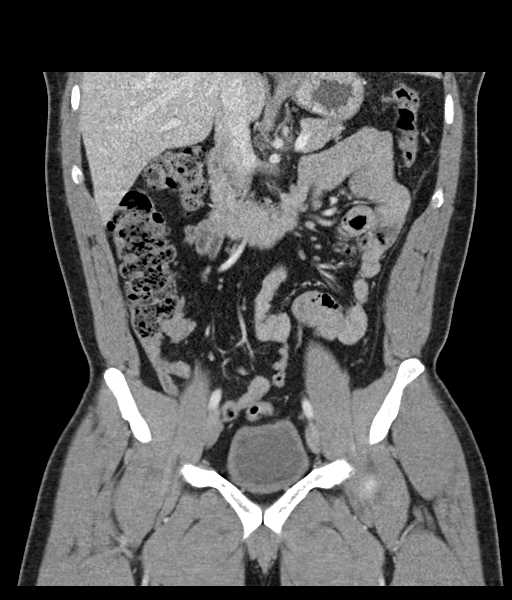
[im 51/92  soft-tissue]
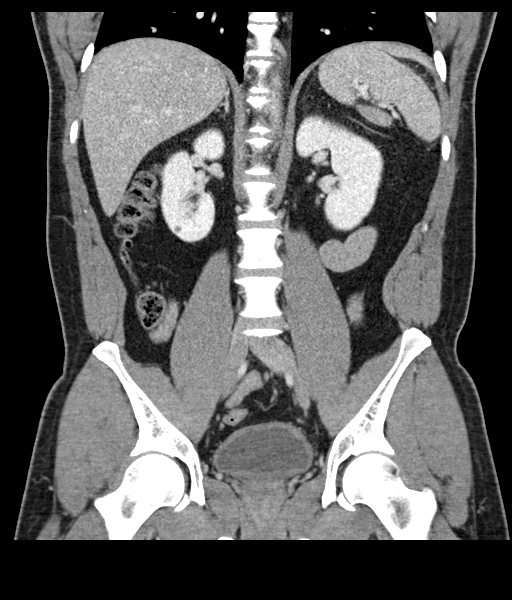

[16 of 46 positions shown; findings below may reference images not displayed]

FINDINGS: Lower chest: Minimal left basilar atelectasis noted.

Hepatobiliary: The liver and gallbladder are unremarkable. No
biliary dilatation.

Pancreas: Unremarkable

Spleen: A very small amount of fluid/stranding along the superior
aspect of the spleen identified without splenic abnormality. No
other abnormalities in this area identified.

Adrenals/Urinary Tract: The kidneys, bladder and adrenal glands are
unremarkable.

Stomach/Bowel: Stomach is within normal limits. Appendix appears
normal. No evidence of bowel wall thickening, distention, or
inflammatory changes.

Vascular/Lymphatic: No significant vascular findings are present. No
enlarged abdominal or pelvic lymph nodes.

Reproductive: Prostate is unremarkable.

Other: No abdominal wall hernia or abnormality. No abdominopelvic
ascites.

Musculoskeletal: No acute or significant osseous findings.
IMPRESSION: 1. Very small amount of fluid/inflammation adjacent to the spleen
without splenic or other adjacent abnormality. This is of uncertain
clinical significance, but may be causing this patient's left upper
abdominal pain. Correlate with any injury to this area.
2. No other significant abnormalities.

## 2018-09-11 DIAGNOSIS — F413 Other mixed anxiety disorders: Secondary | ICD-10-CM | POA: Diagnosis not present

## 2020-08-07 ENCOUNTER — Telehealth: Payer: BLUE CROSS/BLUE SHIELD | Admitting: Physician Assistant

## 2021-09-30 ENCOUNTER — Encounter: Payer: Self-pay | Admitting: *Deleted
